# Patient Record
Sex: Female | Born: 1995 | Race: Black or African American | Hispanic: No | Marital: Single | Smoking: Never smoker
Health system: Southern US, Community
[De-identification: ages and names within clinical notes are randomized; demographics above are authoritative.]

## PROBLEM LIST (undated history)

## (undated) HISTORY — PX: VULVA SURGERY: SHX837

---
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
## 2013-03-13 IMAGING — CT CT HEAD W/O CM
1 series · 16 of 30 positions shown, 20 images · non-contrast
Comparison: None.

CLINICAL DATA: Headaches for 1 day.

CT HEAD WITHOUT CONTRAST
TECHNIQUE: Contiguous axial images were obtained from the base of
the skull through the vertex without contrast

[Series 2: head_seq 4.5 h37s st · axial · 0.43mm/px · z∈[-155,-29]mm · 16 of 32 slices shown, 20 images]
[im 2/32  brain]
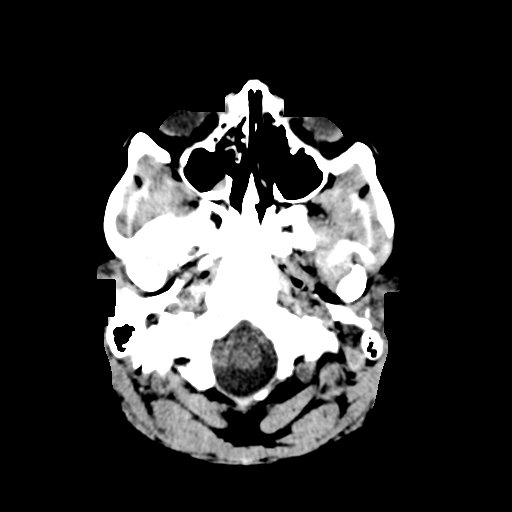
[im 2/32  bone]
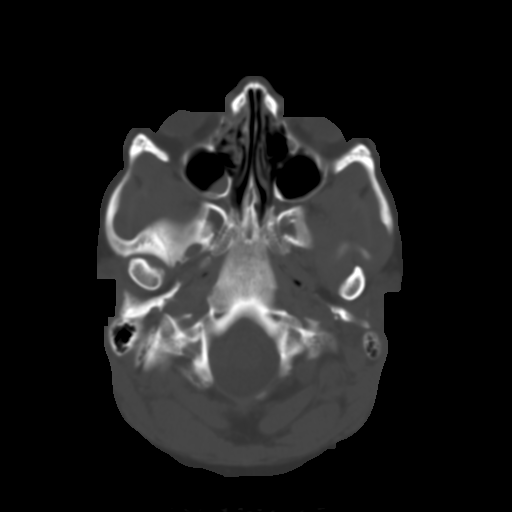
[im 4/32  brain]
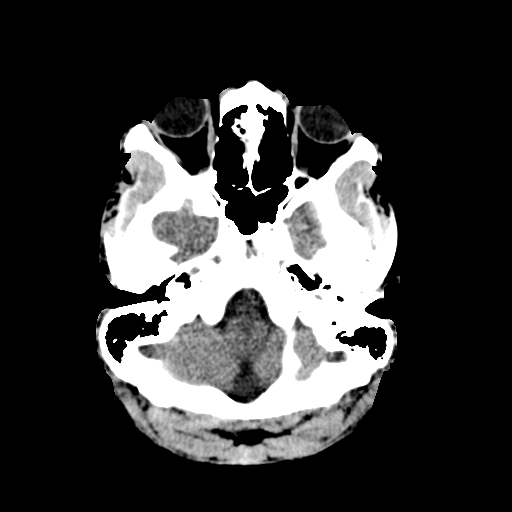
[im 6/32  brain]
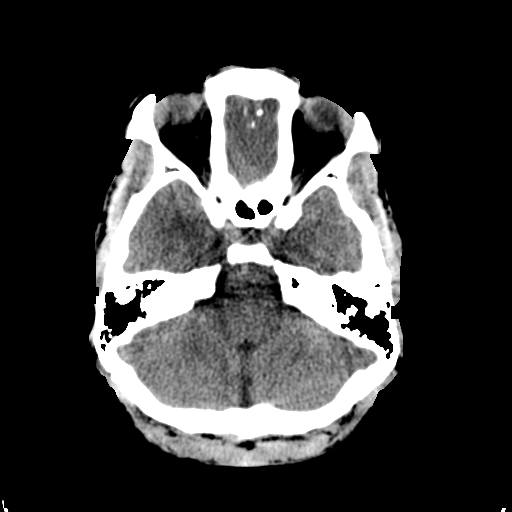
[im 8/32  brain]
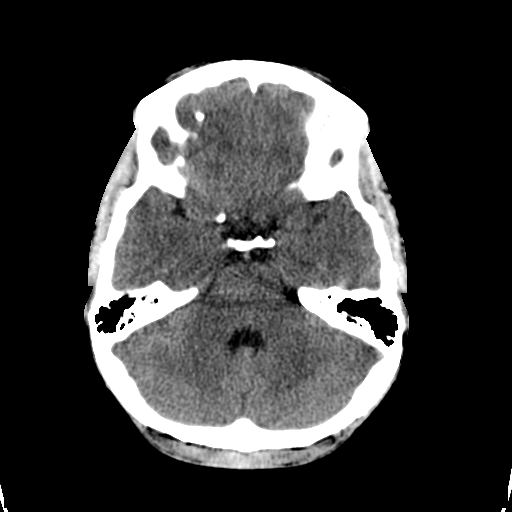
[im 9/32  brain]
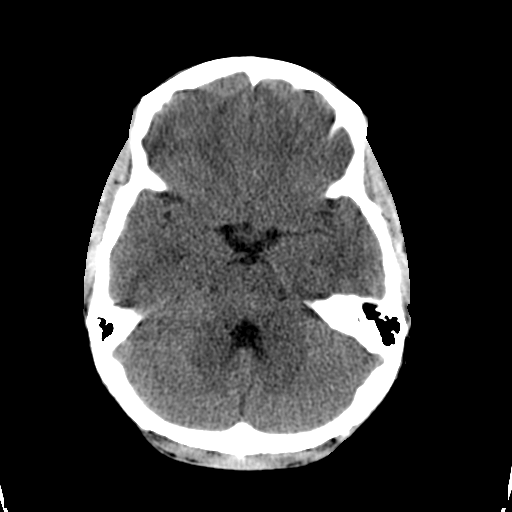
[im 9/32  bone]
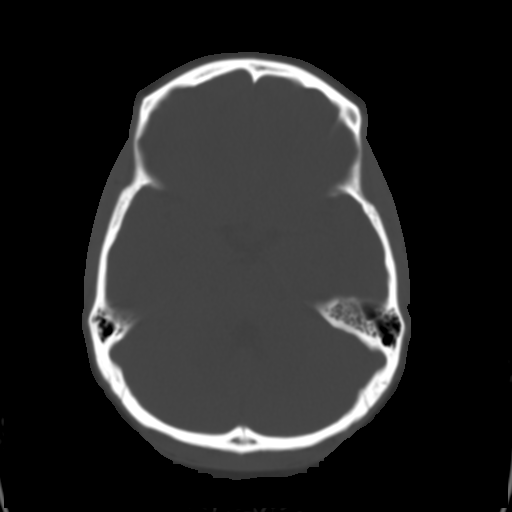
[im 11/32  brain]
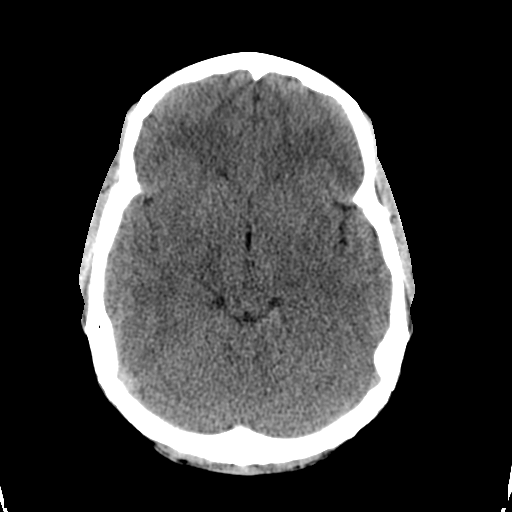
[im 13/32  brain]
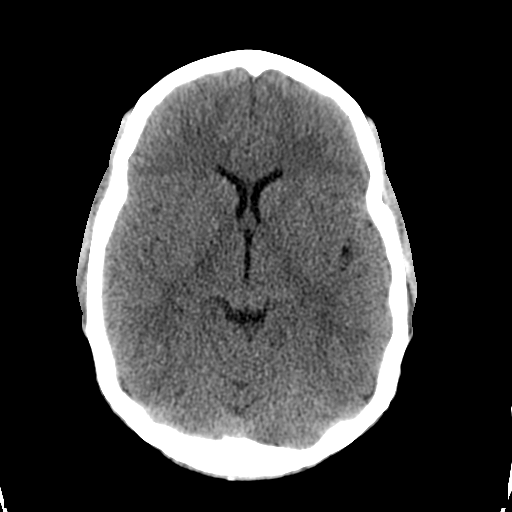
[im 15/32  brain]
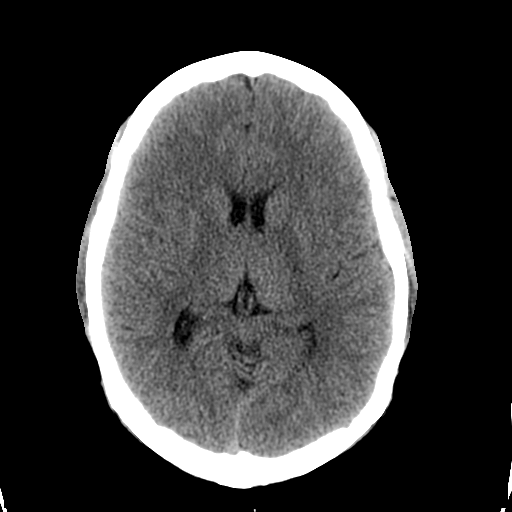
[im 17/32  brain]
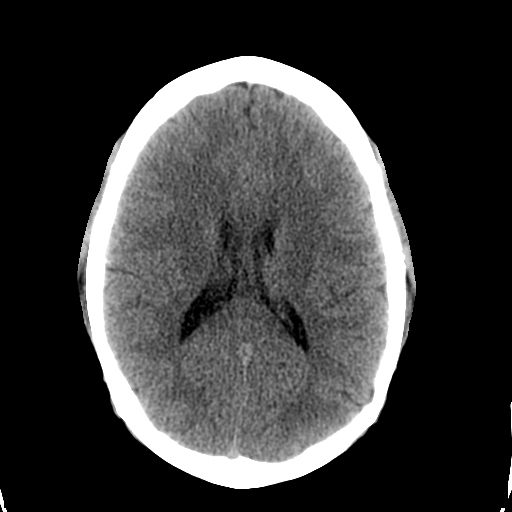
[im 17/32  bone]
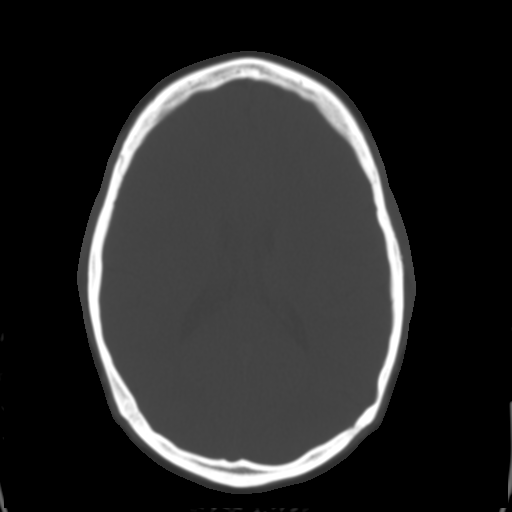
[im 19/32  brain]
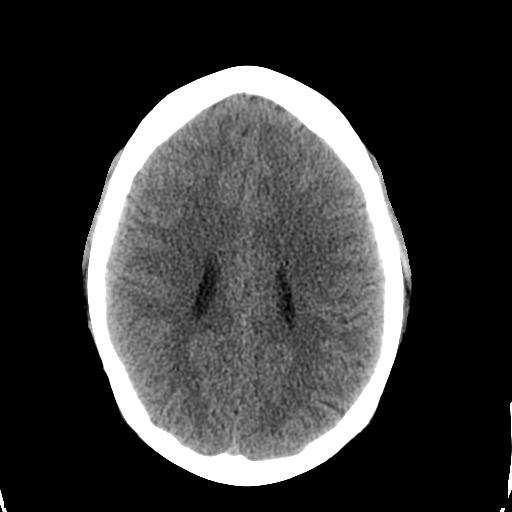
[im 21/32  brain]
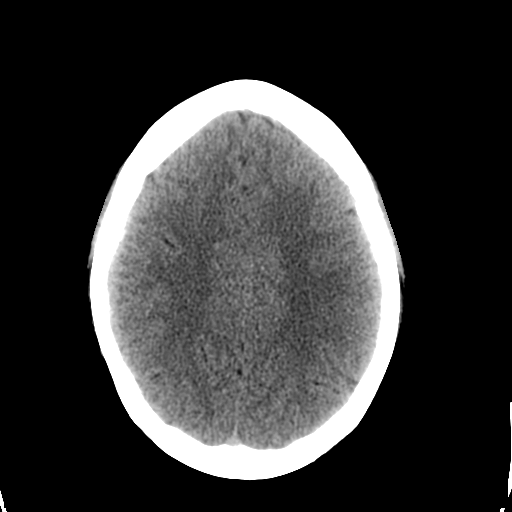
[im 23/32  brain]
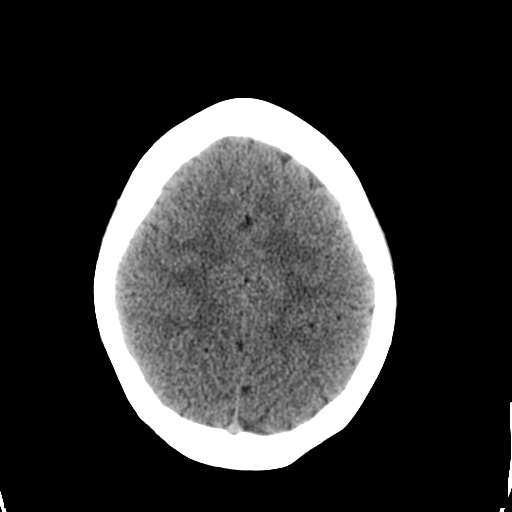
[im 24/32  brain]
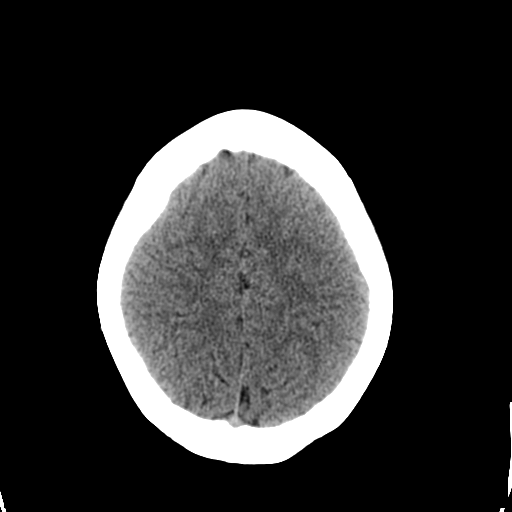
[im 24/32  bone]
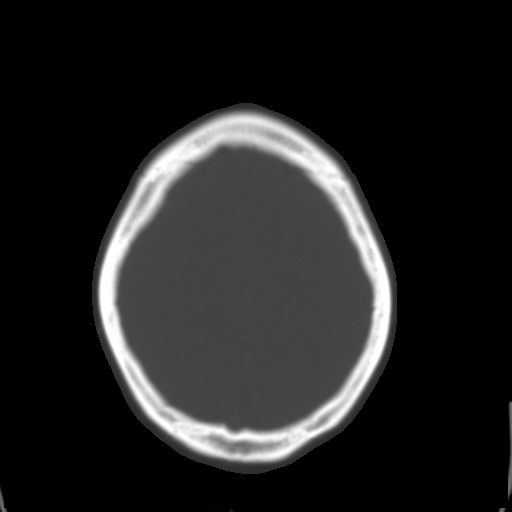
[im 26/32  brain]
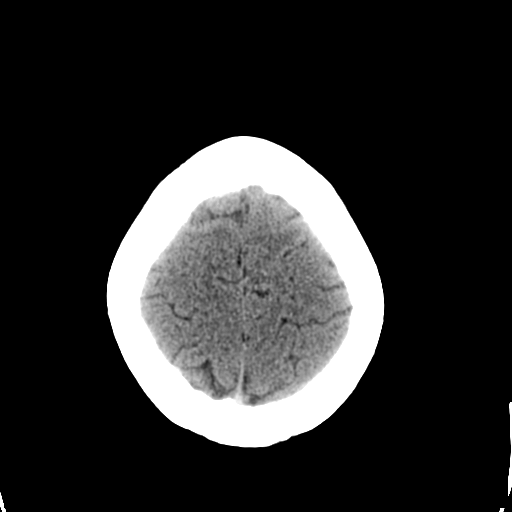
[im 28/32  brain]
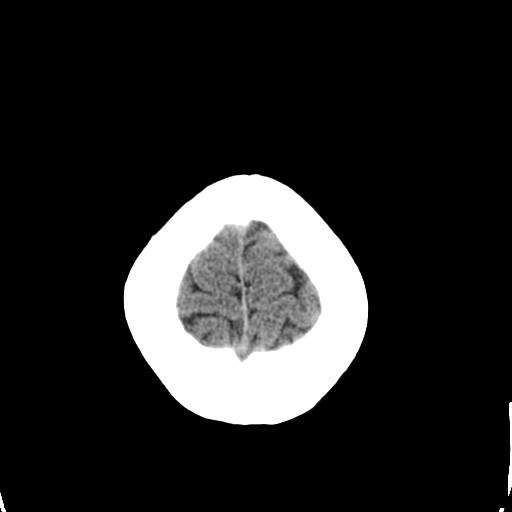
[im 30/32  brain]
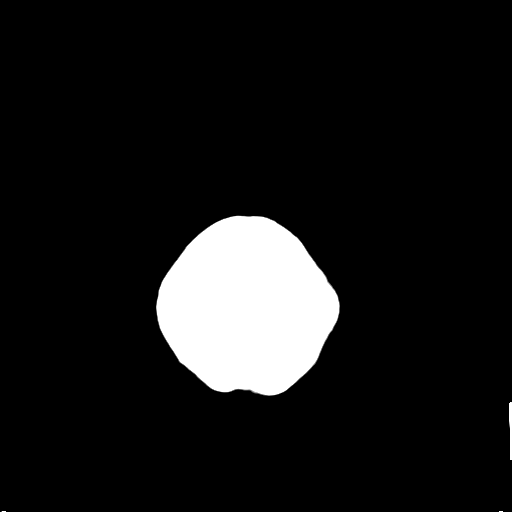

[16 of 30 positions shown; findings below may reference images not displayed]

FINDINGS: The brain has a normal appearance without evidence for
hemorrhage, acute infarction, hydrocephalus, or mass lesion.  There
is no extra axial fluid collection.  The skull is normal.  There is
mild fluid accumulation in the right maxillary sinus, incompletely
evaluated.  Mastoid air cells are clear.
IMPRESSION: Incompletely evaluated right maxillary sinus
fluid.  Normal intracranial compartment.

## 2013-04-25 ENCOUNTER — Ambulatory Visit: Payer: 59 | Admitting: Advanced Practice Midwife

## 2013-10-09 ENCOUNTER — Encounter: Payer: Self-pay | Admitting: Obstetrics & Gynecology

## 2013-10-09 ENCOUNTER — Ambulatory Visit (INDEPENDENT_AMBULATORY_CARE_PROVIDER_SITE_OTHER): Payer: 59 | Admitting: Obstetrics & Gynecology

## 2013-10-09 VITALS — BP 125/74 | HR 76 | Temp 98.7°F | Ht 67.0 in | Wt 231.2 lb

## 2013-10-09 DIAGNOSIS — Z975 Presence of (intrauterine) contraceptive device: Secondary | ICD-10-CM

## 2013-10-09 NOTE — Progress Notes (Signed)
Subjective:     Patient ID: Megan Hickman, female   DOB: 10/17/1995, 18 y.o.   MRN: [id number]  [id number] No LMP recorded. Patient is not currently having periods (Reason: IUD). Concerned about amenorrhea with Mirena since 07/2010. No other problems and wants birth control. Past Medical History  Diagnosis Date  . Asthma    Past Surgical History  Procedure Laterality Date  . Vulva surgery     Allergies  Allergen Reactions  . Other Itching    Animal fur  . Penicillins Other (See Comments)    pt's father has allergy (nausea) but unknown if pt herself has allergy or not  . Shellfish Allergy Swelling   Current Outpatient Prescriptions on File Prior to Visit  Medication Sig Dispense Refill  . albuterol (PROVENTIL HFA;VENTOLIN HFA) 108 (90 BASE) MCG/ACT inhaler Inhale 2 puffs into the lungs every 6 (six) hours as needed for wheezing or shortness of breath. For asthma      . fluconazole (DIFLUCAN) 150 MG tablet Take 150 mg by mouth once.      . hydrOXYzine (ATARAX/VISTARIL) 25 MG tablet Take 1-2 tablets (25-50 mg total) by mouth every 6 (six) hours as needed for itching (may cause drowsiness).  30 tablet  0  . levonorgestrel (MIRENA) 20 MCG/24HR IUD 1 each by Intrauterine route once.      . triamcinolone cream (KENALOG) 0.1 % Apply topically 2 (two) times daily.  45 g  0   Current Facility-Administered Medications on File Prior to Visit  Medication Dose Route Frequency Provider Last Rate Last Dose  . TDaP (BOOSTRIX) injection 0.5 mL  0.5 mL Intramuscular Once Adam PhenixJames G Arnold, MD          Review of Systems  Constitutional: Negative.   Respiratory: Negative.   Genitourinary: Negative for vaginal bleeding, vaginal discharge and menstrual problem.       Objective:   Physical Exam  Constitutional: She is oriented to person, place, and time. She appears well-developed. No distress.  Pulmonary/Chest: Effort normal. No respiratory distress.  Neurological: She is alert and oriented to  person, place, and time.  Skin: Skin is warm and dry.  Psychiatric: She has a normal mood and affect. Her behavior is normal.       Assessment:     Amenorrhea with Mirena      Plan:     Explained reason for no menses and was given reassurance. Remove and possibly replace IUD in 07/2015  Adam PhenixJames G Arnold, MD 10/09/2013

## 2013-10-09 NOTE — Patient Instructions (Signed)
Levonorgestrel intrauterine device (IUD) What is this medicine? LEVONORGESTREL IUD (LEE voe nor jes trel) is a contraceptive (birth control) device. The device is placed inside the uterus by a healthcare professional. It is used to prevent pregnancy and can also be used to treat heavy bleeding that occurs during your period. Depending on the device, it can be used for 3 to 5 years. This medicine may be used for other purposes; ask your health care provider or pharmacist if you have questions. COMMON BRAND NAME(S): Mirena, Skyla What should I tell my health care provider before I take this medicine? They need to know if you have any of these conditions: -abnormal Pap smear -cancer of the breast, uterus, or cervix -diabetes -endometritis -genital or pelvic infection now or in the past -have more than one sexual partner or your partner has more than one partner -heart disease -history of an ectopic or tubal pregnancy -immune system problems -IUD in place -liver disease or tumor -problems with blood clots or take blood-thinners -use intravenous drugs -uterus of unusual shape -vaginal bleeding that has not been explained -an unusual or allergic reaction to levonorgestrel, other hormones, silicone, or polyethylene, medicines, foods, dyes, or preservatives -pregnant or trying to get pregnant -breast-feeding How should I use this medicine? This device is placed inside the uterus by a health care professional. Talk to your pediatrician regarding the use of this medicine in children. Special care may be needed. Overdosage: If you think you have taken too much of this medicine contact a poison control center or emergency room at once. NOTE: This medicine is only for you. Do not share this medicine with others. What if I miss a dose? This does not apply. What may interact with this medicine? Do not take this medicine with any of the following  medications: -amprenavir -bosentan -fosamprenavir This medicine may also interact with the following medications: -aprepitant -barbiturate medicines for inducing sleep or treating seizures -bexarotene -griseofulvin -medicines to treat seizures like carbamazepine, ethotoin, felbamate, oxcarbazepine, phenytoin, topiramate -modafinil -pioglitazone -rifabutin -rifampin -rifapentine -some medicines to treat HIV infection like atazanavir, indinavir, lopinavir, nelfinavir, tipranavir, ritonavir -St. John's wort -warfarin This list may not describe all possible interactions. Give your health care provider a list of all the medicines, herbs, non-prescription drugs, or dietary supplements you use. Also tell them if you smoke, drink alcohol, or use illegal drugs. Some items may interact with your medicine. What should I watch for while using this medicine? Visit your doctor or health care professional for regular check ups. See your doctor if you or your partner has sexual contact with others, becomes HIV positive, or gets a sexual transmitted disease. This product does not protect you against HIV infection (AIDS) or other sexually transmitted diseases. You can check the placement of the IUD yourself by reaching up to the top of your vagina with clean fingers to feel the threads. Do not pull on the threads. It is a good habit to check placement after each menstrual period. Call your doctor right away if you feel more of the IUD than just the threads or if you cannot feel the threads at all. The IUD may come out by itself. You may become pregnant if the device comes out. If you notice that the IUD has come out use a backup birth control method like condoms and call your health care provider. Using tampons will not change the position of the IUD and are okay to use during your period. What side effects may I   notice from receiving this medicine? Side effects that you should report to your doctor or  health care professional as soon as possible: -allergic reactions like skin rash, itching or hives, swelling of the face, lips, or tongue -fever, flu-like symptoms -genital sores -high blood pressure -no menstrual period for 6 weeks during use -pain, swelling, warmth in the leg -pelvic pain or tenderness -severe or sudden headache -signs of pregnancy -stomach cramping -sudden shortness of breath -trouble with balance, talking, or walking -unusual vaginal bleeding, discharge -yellowing of the eyes or skin Side effects that usually do not require medical attention (report to your doctor or health care professional if they continue or are bothersome): -acne -breast pain -change in sex drive or performance -changes in weight -cramping, dizziness, or faintness while the device is being inserted -headache -irregular menstrual bleeding within first 3 to 6 months of use -nausea This list may not describe all possible side effects. Call your doctor for medical advice about side effects. You may report side effects to FDA at 1-800-FDA-1088. Where should I keep my medicine? This does not apply. NOTE: This sheet is a summary. It may not cover all possible information. If you have questions about this medicine, talk to your doctor, pharmacist, or health care provider.  2015, [employer] Standard. (2011-05-05 13:54:04)  

## 2014-02-01 ENCOUNTER — Emergency Department (HOSPITAL_COMMUNITY)
Admission: EM | Admit: 2014-02-01 | Discharge: 2014-02-01 | Disposition: A | Discharge status: Home / Self Care | Payer: 59 | Source: Home / Self Care | Attending: Family Medicine | Admitting: Family Medicine

## 2014-02-01 ENCOUNTER — Encounter (HOSPITAL_COMMUNITY): Payer: Self-pay | Admitting: Emergency Medicine

## 2014-02-01 DIAGNOSIS — M940 Chondrocostal junction syndrome [Tietze]: Secondary | ICD-10-CM

## 2014-02-01 MED ORDER — MELOXICAM 7.5 MG PO TABS: 7.5000 mg | ORAL_TABLET | Freq: Two times a day (BID) | ORAL | Status: DC

## 2014-02-01 NOTE — ED Notes (Signed)
Pt      Reports    Sternal     Pain  X  6  Months   - worse  On  Movement  And   Palpation            Pt  Reports  Pain is  intermiitant     And  Worse  Over  Last  Several  Days

## 2014-02-01 NOTE — ED Provider Notes (Signed)
CSN: [id number]     Arrival date & time 02/01/14  1352 History   First MD Initiated Contact with Patient 02/01/14 1409     Chief Complaint  Patient presents with  . Chest Pain   (Consider location/radiation/quality/duration/timing/severity/associated sxs/prior Treatment) Patient is a 18 y.o. female presenting with chest pain. The history is provided by the patient.  Chest Pain Pain location:  L chest Pain quality: sharp   Pain radiates to:  Does not radiate Pain radiates to the back: no   Pain severity:  Mild Onset quality:  Gradual Duration:  6 months Progression:  Worsening Chronicity:  Chronic Relieved by:  Certain positions Worsened by:  Nothing tried Ineffective treatments:  None tried Associated symptoms: no abdominal pain, no back pain, no cough, no nausea, no palpitations, no shortness of breath and not vomiting     Past Medical History  Diagnosis Date  . Asthma    Past Surgical History  Procedure Laterality Date  . Vulva surgery     Family History  Problem Relation Age of Onset  . Cancer Mother     breast  . Diabetes Father   . Cancer Maternal Aunt     cancer  . Cancer Maternal Grandmother     breast cancer  . Diabetes Paternal Grandmother    History  Substance Use Topics  . Smoking status: Never Smoker   . Smokeless tobacco: Never Used  . Alcohol Use: No   OB History   Grav Para Term Preterm Abortions TAB SAB Ect Mult Living   0              Review of Systems  Constitutional: Negative.   HENT: Negative.   Respiratory: Negative for cough and shortness of breath.   Cardiovascular: Positive for chest pain. Negative for palpitations and leg swelling.  Gastrointestinal: Negative for nausea, vomiting and abdominal pain.  Musculoskeletal: Negative for back pain.    Allergies  Other; Penicillins; and Shellfish allergy  Home Medications   Prior to Admission medications   Medication Sig Start Date End Date Taking? Authorizing Provider  albuterol  (PROVENTIL HFA;VENTOLIN HFA) 108 (90 BASE) MCG/ACT inhaler Inhale 2 puffs into the lungs every 6 (six) hours as needed for wheezing or shortness of breath. For asthma    Historical Provider, MD  fluconazole (DIFLUCAN) 150 MG tablet Take 150 mg by mouth once.    Historical Provider, MD  hydrOXYzine (ATARAX/VISTARIL) 25 MG tablet Take 1-2 tablets (25-50 mg total) by mouth every 6 (six) hours as needed for itching (may cause drowsiness). 11/24/12   Carlisle BeersJohn L Molpus, MD  levonorgestrel (MIRENA) 20 MCG/24HR IUD 1 each by Intrauterine route once.    Historical Provider, MD  meloxicam (MOBIC) 7.5 MG tablet Take 1 tablet (7.5 mg total) by mouth 2 (two) times daily after a meal. 02/01/14   Linna HoffJames D Ethelean Colla, MD  triamcinolone cream (KENALOG) 0.1 % Apply topically 2 (two) times daily. 11/23/12   Ethelda ChickKristi M Smith, MD   BP 150/95  Pulse 102  Temp(Src) 98.3 F (36.8 C) (Oral)  Resp 16  SpO2 100% Physical Exam  Nursing note and vitals reviewed. Constitutional: She is oriented to person, place, and time. She appears well-developed and well-nourished.  HENT:  Right Ear: External ear normal.  Left Ear: External ear normal.  Mouth/Throat: Oropharynx is clear and moist.  Neck: Normal range of motion. Neck supple.  Cardiovascular: Normal rate, regular rhythm, normal heart sounds and intact distal pulses.   Pulmonary/Chest: Effort normal and  breath sounds normal. She exhibits tenderness.  Lymphadenopathy:    She has no cervical adenopathy.  Neurological: She is alert and oriented to person, place, and time.  Skin: Skin is warm and dry.    ED Course  Procedures (including critical care time) Labs Review Labs Reviewed - No data to display  Imaging Review No results found.   MDM   1. Acute costochondritis        Linna HoffJames D Andrian Urbach, MD 02/05/14 2021

## 2014-06-01 IMAGING — US US RENAL
1 series · 14 of 25 positions shown · non-contrast
Comparison: Pelvis radiograph 03/23/2007.

CLINICAL DATA: 15-year-old female left flank pain.
Hematuria.

RENAL/URINARY TRACT ULTRASOUND COMPLETE

[Series 1: us renal · 0.25mm/px · 14 of 32 slices shown]
[im 1/32]
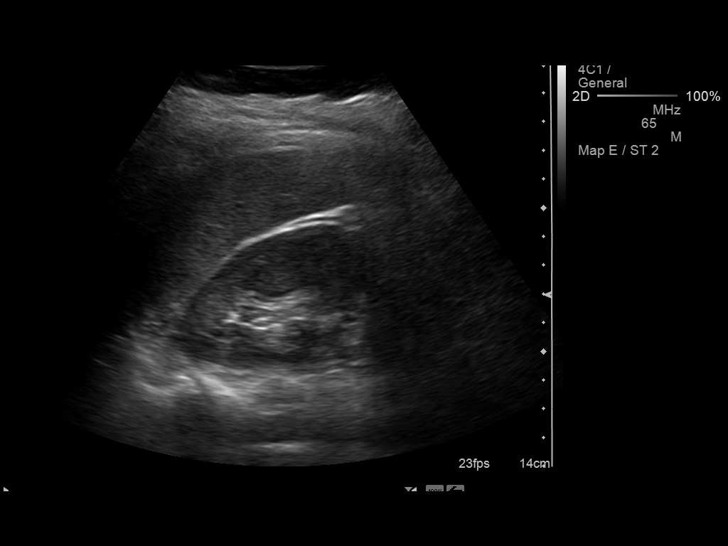
[im 3/32]
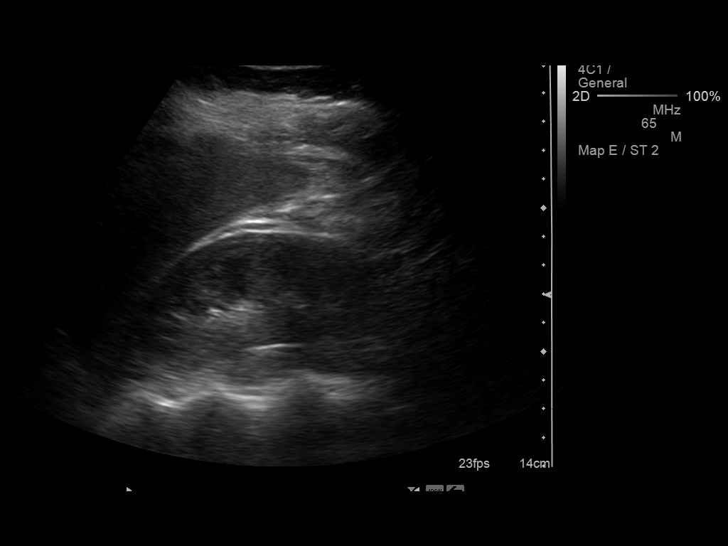
[im 6/32]
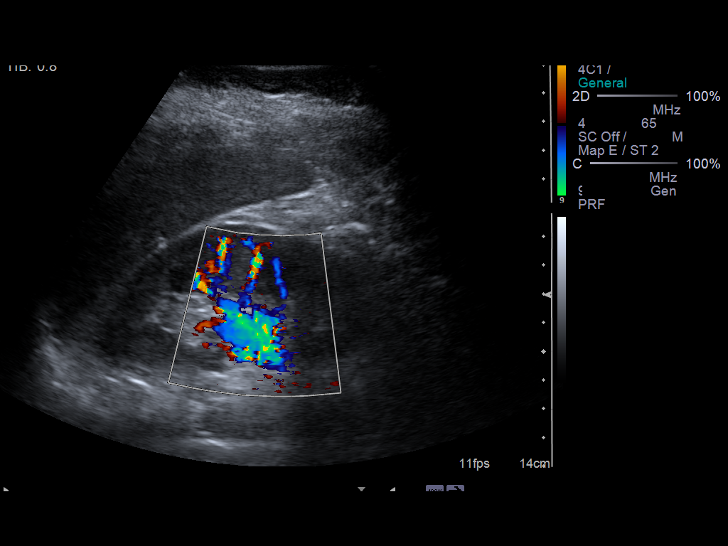
[im 8/32]
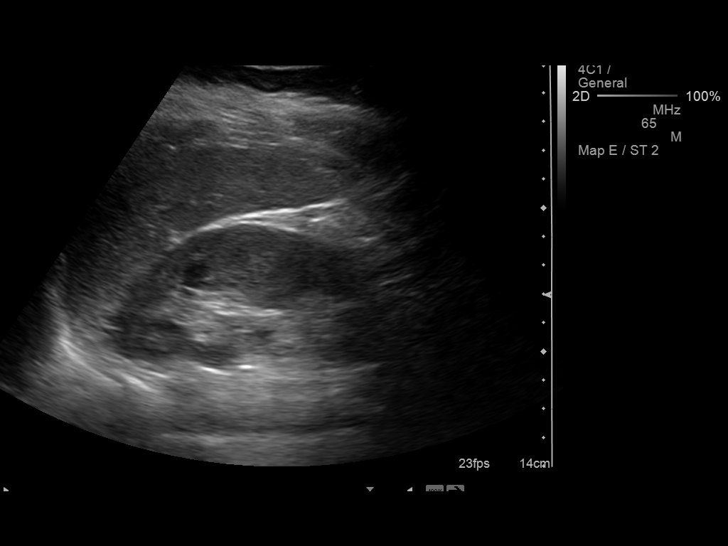
[im 11/32]
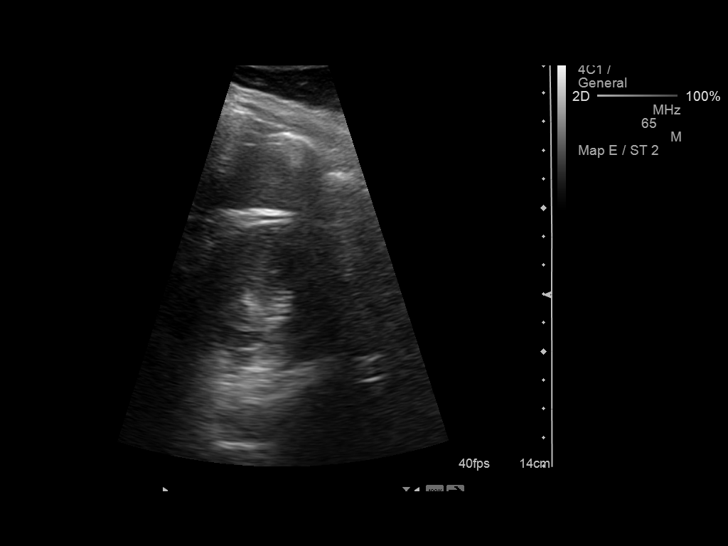
[im 12/32]
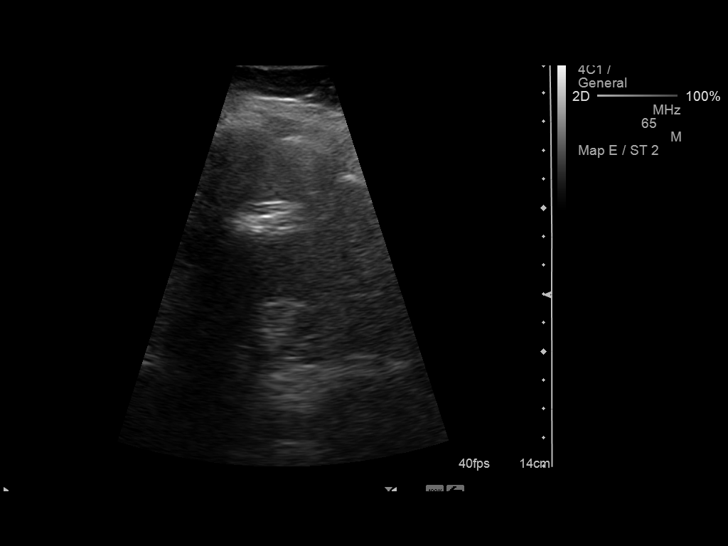
[im 15/32]
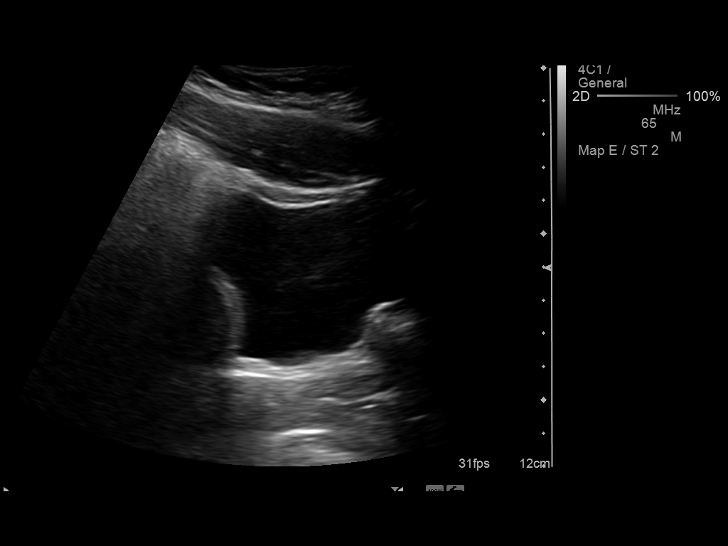
[im 17/32]
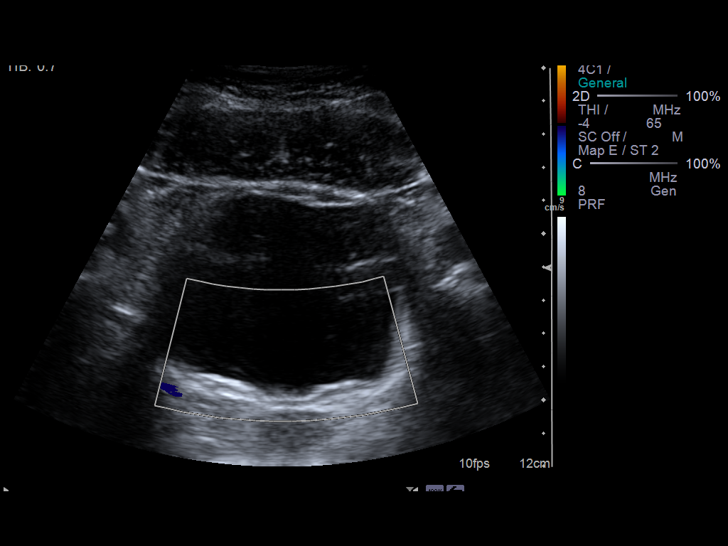
[im 20/32]
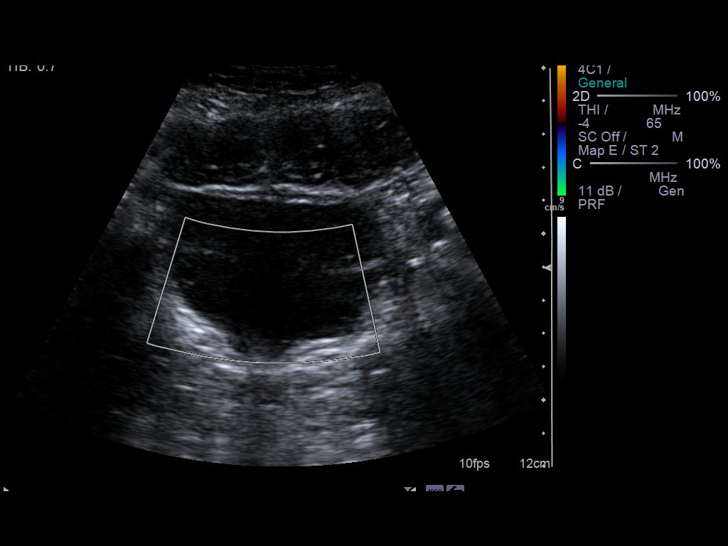
[im 21/32]
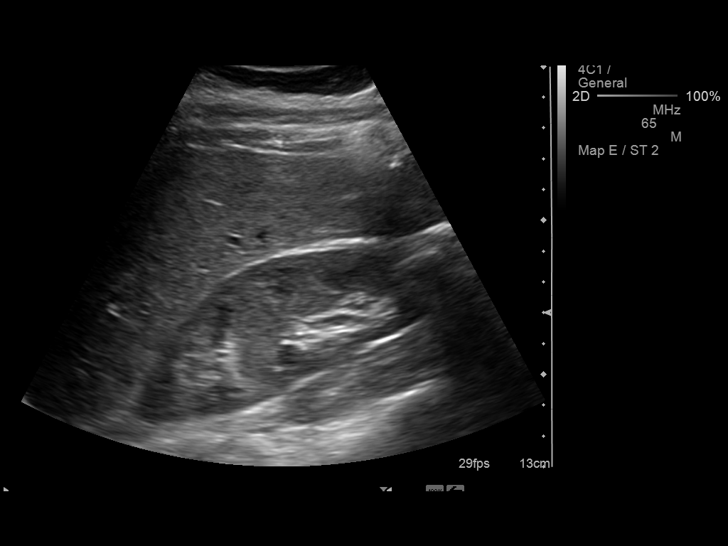
[im 24/32]
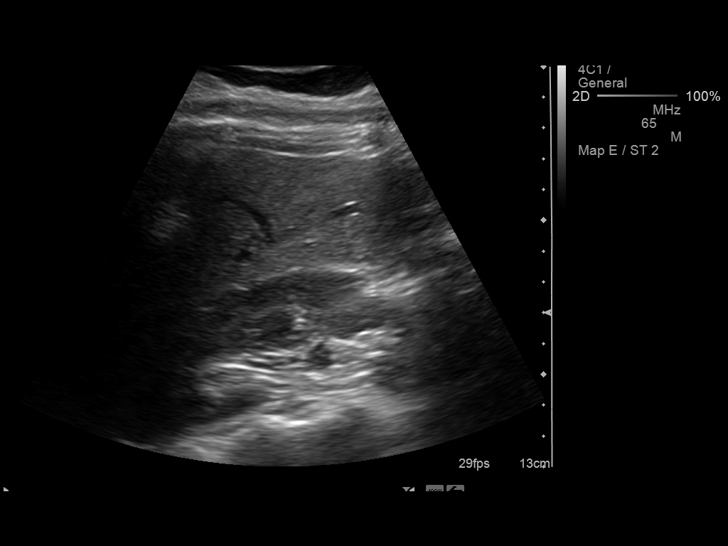
[im 26/32]
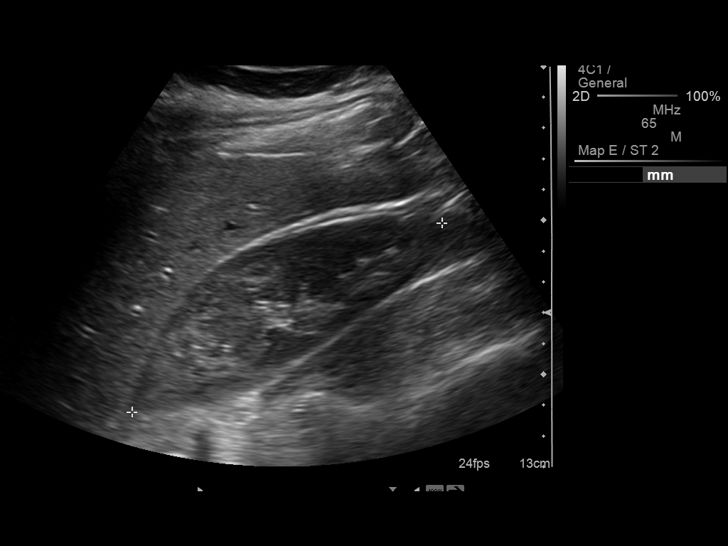
[im 29/32]
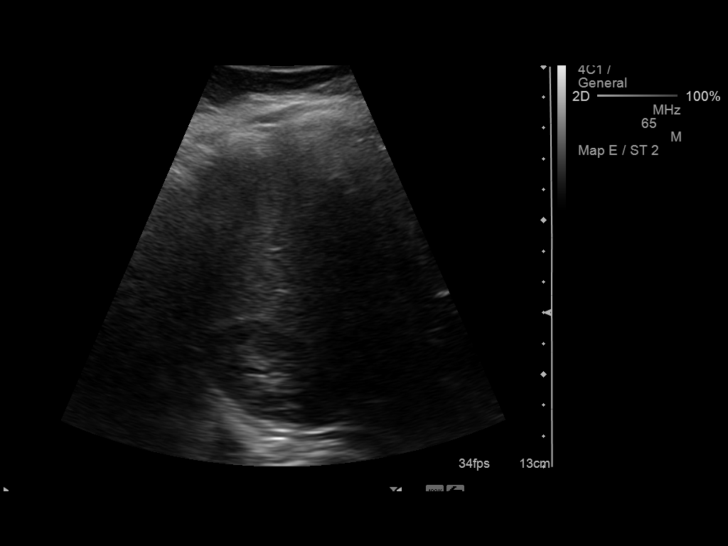
[im 32/32]
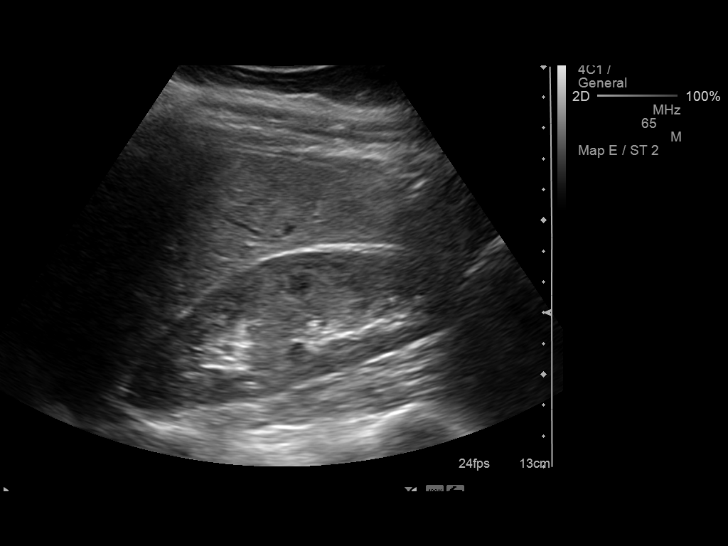

[14 of 25 positions shown; findings below may reference images not displayed]

FINDINGS: Right Kidney:  No hydronephrosis.  Renal length 12.0 cm.  Cortical
echotexture and corticomedullary differentiation within normal
limits.

Left Kidney:  No hydronephrosis.  Suboptimal visualization of the
lower pole due to overlying bowel gas, but no focal left renal
lesion.  Cortical echotexture and corticomedullary differentiation
within normal limits.  Renal length approximately 10 cm.  No focal
left renal lesion. No proximal left hydroureter evident.

Bladder:  Unremarkable.  A right ureteral jet is visible with
Doppler.  The left ureteral jet was not visible.  No distal left
hydroureter is evident.
IMPRESSION: Non visualized left ureteral jet, but otherwise sonographic
appearance of the kidneys and bladder is within normal limits.  No
other secondary findings to suggest obstructive uropathy.

## 2014-11-04 ENCOUNTER — Other Ambulatory Visit: Payer: Self-pay | Admitting: Obstetrics and Gynecology

## 2014-11-05 ENCOUNTER — Encounter: Payer: 59 | Admitting: Certified Nurse Midwife

## 2016-03-23 DIAGNOSIS — H52223 Regular astigmatism, bilateral: Secondary | ICD-10-CM | POA: Diagnosis not present

## 2016-05-13 DIAGNOSIS — F4323 Adjustment disorder with mixed anxiety and depressed mood: Secondary | ICD-10-CM | POA: Diagnosis not present

## 2016-08-04 DIAGNOSIS — J45909 Unspecified asthma, uncomplicated: Secondary | ICD-10-CM | POA: Diagnosis not present

## 2016-08-04 DIAGNOSIS — F4323 Adjustment disorder with mixed anxiety and depressed mood: Secondary | ICD-10-CM | POA: Diagnosis not present

## 2016-08-04 DIAGNOSIS — R51 Headache: Secondary | ICD-10-CM | POA: Diagnosis not present

## 2016-08-05 MED FILL — PROAIR HFA 90 MCG INHALER: 108 (90 BAS | 17 days supply | Qty: 9 | Fill #0

## 2016-08-05 MED FILL — TOPIRAMATE 25 MG TABLET: 25 | 30 days supply | Qty: 30 | Fill #0

## 2016-09-01 MED FILL — PROAIR HFA 90 MCG INHALER: 108 (90 BAS | 17 days supply | Qty: 9 | Fill #1

## 2016-09-16 DIAGNOSIS — R51 Headache: Secondary | ICD-10-CM | POA: Diagnosis not present

## 2016-09-16 DIAGNOSIS — F4323 Adjustment disorder with mixed anxiety and depressed mood: Secondary | ICD-10-CM | POA: Diagnosis not present

## 2016-09-16 DIAGNOSIS — J45909 Unspecified asthma, uncomplicated: Secondary | ICD-10-CM | POA: Diagnosis not present

## 2020-07-23 DIAGNOSIS — F330 Major depressive disorder, recurrent, mild: Secondary | ICD-10-CM | POA: Diagnosis not present

## 2020-08-03 ENCOUNTER — Other Ambulatory Visit: Payer: Self-pay

## 2020-08-03 ENCOUNTER — Encounter (HOSPITAL_COMMUNITY): Payer: Self-pay

## 2020-08-03 ENCOUNTER — Ambulatory Visit (HOSPITAL_COMMUNITY)
Admission: EM | Admit: 2020-08-03 | Discharge: 2020-08-03 | Disposition: A | Discharge status: Home / Self Care | Payer: BC Managed Care – PPO | Attending: Emergency Medicine | Admitting: Emergency Medicine

## 2020-08-03 DIAGNOSIS — M545 Low back pain, unspecified: Secondary | ICD-10-CM | POA: Diagnosis not present

## 2020-08-03 MED ORDER — CYCLOBENZAPRINE HCL 10 MG PO TABS: 10.0000 mg | ORAL_TABLET | Freq: Two times a day (BID) | ORAL | 0 refills | Status: AC | PRN

## 2020-08-03 MED ORDER — NAPROXEN 500 MG PO TABS: 500.0000 mg | ORAL_TABLET | Freq: Two times a day (BID) | ORAL | 0 refills | Status: AC

## 2020-08-03 NOTE — ED Triage Notes (Signed)
Pt c/o back spasms x 3 days. Pt states she felt immobilized and called EMT.

## 2020-08-03 NOTE — Discharge Instructions (Addendum)
Take naproxen 500 mg twice a day for 5 days then as needed  Take flexeril twice a day as needed. Be mindful this can your drowsy   Heating pad 15 minute intervals  Stretching as tolerated

## 2020-08-03 NOTE — ED Provider Notes (Signed)
[facility]    CSN: [id number] Arrival date & time: 08/03/20  1355      History   Chief Complaint No chief complaint on file.   HPI Megan Hickman is a 25 y.o. female.   Patient presents with left sided lower back pain  6/10 starting 2 days ago after picking item off ground. Became stuck and unable to move from position. Required EMT services to get into sitting position. Currently walking with cane for support. Pain radiates into left hip, worsened with twisting turning and bending. Denies numbness, tingling, urinary changes. This has occurred once before. Attempted use of ibuprofen once but EMT told her to discontinue medication. Using heating pad for comfort.   Past Medical History:  Diagnosis Date  . Asthma     Patient Active Problem List   Diagnosis Date Noted  . Elevated blood pressure (not hypertension) 04/23/2012  . IUD (intrauterine device) in place 07/15/2011  . Bacterial vaginosis 07/15/2011    Past Surgical History:  Procedure Laterality Date  . VULVA SURGERY      OB History    Gravida  0   Para      Term      Preterm      AB      Living        SAB      IAB      Ectopic      Multiple      Live Births               Home Medications    Prior to Admission medications   Medication Sig Start Date End Date Taking? Authorizing Provider  cyclobenzaprine (FLEXERIL) 10 MG tablet Take 1 tablet (10 mg total) by mouth 2 (two) times daily as needed for muscle spasms. 08/03/20  Yes Lanis Storlie R, NP  naproxen (NAPROSYN) 500 MG tablet Take 1 tablet (500 mg total) by mouth 2 (two) times daily. 08/03/20  Yes Karmine Kauer R, NP  albuterol (PROVENTIL HFA;VENTOLIN HFA) 108 (90 BASE) MCG/ACT inhaler Inhale 2 puffs into the lungs every 6 (six) hours as needed for wheezing or shortness of breath. For asthma    [provider]  fluconazole (DIFLUCAN) 150 MG tablet Take 150 mg by mouth once.    [provider]  hydrOXYzine  (ATARAX/VISTARIL) 25 MG tablet Take 1-2 tablets (25-50 mg total) by mouth every 6 (six) hours as needed for itching (may cause drowsiness). 11/24/12   Molpus, John, MD  levonorgestrel (MIRENA) 20 MCG/24HR IUD 1 each by Intrauterine route once.    [provider]  triamcinolone cream (KENALOG) 0.1 % Apply topically 2 (two) times daily. 11/23/12   Ethelda Chick, MD    Family History Family History  Problem Relation Age of Onset  . Cancer Mother        breast  . Diabetes Father   . Cancer Maternal Aunt        cancer  . Cancer Maternal Grandmother        breast cancer  . Diabetes Paternal Grandmother     Social History Social History   Tobacco Use  . Smoking status: Never Smoker  . Smokeless tobacco: Never Used  Substance Use Topics  . Alcohol use: No  . Drug use: No     Allergies   Other, Penicillins, and Shellfish allergy   Review of Systems Review of Systems  Constitutional: Negative.   HENT: Negative.   Respiratory: Negative.   Cardiovascular:  Negative.   Musculoskeletal: Positive for back pain. Negative for arthralgias, gait problem, joint swelling, myalgias, neck pain and neck stiffness.  Skin: Negative.   Neurological: Negative.      Physical Exam Triage Vital Signs ED Triage Vitals  Enc Vitals Group     BP 08/03/20 1449 (!) 142/60     Pulse Rate 08/03/20 1449 94     Resp 08/03/20 1449 18     Temp 08/03/20 1449 98.9 F (37.2 C)     Temp Source 08/03/20 1449 Oral     SpO2 08/03/20 1449 100 %     Weight --      Height --      Head Circumference --      Peak Flow --      Pain Score 08/03/20 1448 6     Pain Loc --      Pain Edu? --      Excl. in GC? --    No data found.  Updated Vital Signs BP (!) 142/60 (BP Location: Right Arm)   Pulse 94   Temp 98.9 F (37.2 C) (Oral)   Resp 18   LMP 07/13/2020 (Exact Date)   SpO2 100%   Visual Acuity Right Eye Distance:   Left Eye Distance:   Bilateral Distance:    Right Eye Near:   Left Eye  Near:    Bilateral Near:     Physical Exam Constitutional:      Appearance: Normal appearance. She is normal weight.  HENT:     Head: Normocephalic.  Eyes:     Extraocular Movements: Extraocular movements intact.  Pulmonary:     Effort: Pulmonary effort is normal.  Musculoskeletal:     Cervical back: Normal.     Thoracic back: Normal.     Lumbar back: Tenderness present. No swelling or spasms. Decreased range of motion.       Back:  Skin:    General: Skin is warm and dry.  Neurological:     General: No focal deficit present.     Mental Status: She is alert and oriented to person, place, and time. Mental status is at baseline.  Psychiatric:        Mood and Affect: Mood normal.        Behavior: Behavior normal.        Thought Content: Thought content normal.        Judgment: Judgment normal.      UC Treatments / Results  Labs (all labs ordered are listed, but only abnormal results are displayed) Labs Reviewed - No data to display  EKG   Radiology No results found.  Procedures Procedures (including critical care time)  Medications Ordered in UC Medications - No data to display  Initial Impression / Assessment and Plan / UC Course  I have reviewed the triage vital signs and the nursing notes.  Pertinent labs & imaging results that were available during my care of the patient were reviewed by me and considered in my medical decision making (see chart for details).  Acute left sided low back pain without sciatica  1. Naproxen 500 mg bid  2. Flexeril 10 mg bid 3. Gentle stretching as tolerated 4. Heating pad 15 minute intervals  Final Clinical Impressions(s) / UC Diagnoses   Final diagnoses:  Acute left-sided low back pain without sciatica     Discharge Instructions     Take naproxen 500 mg twice a day for 5 days then as needed  Take flexeril  twice a day as needed. Be mindful this can your drowsy   Heating pad 15 minute intervals  Stretching as  tolerated      ED Prescriptions    Medication Sig Dispense Auth. Provider   naproxen (NAPROSYN) 500 MG tablet Take 1 tablet (500 mg total) by mouth 2 (two) times daily. 30 tablet Miriam Kestler R, NP   cyclobenzaprine (FLEXERIL) 10 MG tablet Take 1 tablet (10 mg total) by mouth 2 (two) times daily as needed for muscle spasms. 20 tablet Valinda Hoar, NP     PDMP not reviewed this encounter.   Valinda Hoar, NP 08/03/20 [phone]

## 2020-08-06 DIAGNOSIS — F330 Major depressive disorder, recurrent, mild: Secondary | ICD-10-CM | POA: Diagnosis not present

## 2020-08-19 DIAGNOSIS — F330 Major depressive disorder, recurrent, mild: Secondary | ICD-10-CM | POA: Diagnosis not present

## 2020-09-02 DIAGNOSIS — F330 Major depressive disorder, recurrent, mild: Secondary | ICD-10-CM | POA: Diagnosis not present

## 2020-09-16 DIAGNOSIS — F330 Major depressive disorder, recurrent, mild: Secondary | ICD-10-CM | POA: Diagnosis not present

## 2020-10-14 DIAGNOSIS — F330 Major depressive disorder, recurrent, mild: Secondary | ICD-10-CM | POA: Diagnosis not present

## 2020-10-15 DIAGNOSIS — R635 Abnormal weight gain: Secondary | ICD-10-CM | POA: Diagnosis not present

## 2020-10-15 DIAGNOSIS — F900 Attention-deficit hyperactivity disorder, predominantly inattentive type: Secondary | ICD-10-CM | POA: Diagnosis not present

## 2020-10-15 DIAGNOSIS — F411 Generalized anxiety disorder: Secondary | ICD-10-CM | POA: Diagnosis not present

## 2020-10-15 DIAGNOSIS — F329 Major depressive disorder, single episode, unspecified: Secondary | ICD-10-CM | POA: Diagnosis not present

## 2020-10-28 DIAGNOSIS — F330 Major depressive disorder, recurrent, mild: Secondary | ICD-10-CM | POA: Diagnosis not present

## 2020-10-30 DIAGNOSIS — F411 Generalized anxiety disorder: Secondary | ICD-10-CM | POA: Diagnosis not present

## 2020-10-30 DIAGNOSIS — R635 Abnormal weight gain: Secondary | ICD-10-CM | POA: Diagnosis not present

## 2020-10-30 DIAGNOSIS — F900 Attention-deficit hyperactivity disorder, predominantly inattentive type: Secondary | ICD-10-CM | POA: Diagnosis not present

## 2020-10-30 DIAGNOSIS — F329 Major depressive disorder, single episode, unspecified: Secondary | ICD-10-CM | POA: Diagnosis not present

## 2020-11-11 DIAGNOSIS — F330 Major depressive disorder, recurrent, mild: Secondary | ICD-10-CM | POA: Diagnosis not present

## 2020-11-12 DIAGNOSIS — F900 Attention-deficit hyperactivity disorder, predominantly inattentive type: Secondary | ICD-10-CM | POA: Diagnosis not present

## 2020-11-12 DIAGNOSIS — F329 Major depressive disorder, single episode, unspecified: Secondary | ICD-10-CM | POA: Diagnosis not present

## 2020-11-12 DIAGNOSIS — F411 Generalized anxiety disorder: Secondary | ICD-10-CM | POA: Diagnosis not present

## 2020-11-12 DIAGNOSIS — E669 Obesity, unspecified: Secondary | ICD-10-CM | POA: Diagnosis not present

## 2020-12-09 DIAGNOSIS — F411 Generalized anxiety disorder: Secondary | ICD-10-CM | POA: Diagnosis not present

## 2020-12-09 DIAGNOSIS — F329 Major depressive disorder, single episode, unspecified: Secondary | ICD-10-CM | POA: Diagnosis not present

## 2020-12-09 DIAGNOSIS — F900 Attention-deficit hyperactivity disorder, predominantly inattentive type: Secondary | ICD-10-CM | POA: Diagnosis not present

## 2020-12-22 DIAGNOSIS — F900 Attention-deficit hyperactivity disorder, predominantly inattentive type: Secondary | ICD-10-CM | POA: Diagnosis not present

## 2020-12-22 DIAGNOSIS — F329 Major depressive disorder, single episode, unspecified: Secondary | ICD-10-CM | POA: Diagnosis not present

## 2020-12-22 DIAGNOSIS — R030 Elevated blood-pressure reading, without diagnosis of hypertension: Secondary | ICD-10-CM | POA: Diagnosis not present

## 2020-12-22 DIAGNOSIS — F411 Generalized anxiety disorder: Secondary | ICD-10-CM | POA: Diagnosis not present

## 2021-06-28 DIAGNOSIS — R609 Edema, unspecified: Secondary | ICD-10-CM | POA: Diagnosis not present

## 2021-06-28 DIAGNOSIS — F329 Major depressive disorder, single episode, unspecified: Secondary | ICD-10-CM | POA: Diagnosis not present

## 2021-06-28 DIAGNOSIS — F900 Attention-deficit hyperactivity disorder, predominantly inattentive type: Secondary | ICD-10-CM | POA: Diagnosis not present

## 2021-06-28 DIAGNOSIS — F411 Generalized anxiety disorder: Secondary | ICD-10-CM | POA: Diagnosis not present

## 2021-08-02 DIAGNOSIS — F411 Generalized anxiety disorder: Secondary | ICD-10-CM | POA: Diagnosis not present

## 2021-08-02 DIAGNOSIS — F900 Attention-deficit hyperactivity disorder, predominantly inattentive type: Secondary | ICD-10-CM | POA: Diagnosis not present

## 2021-08-02 DIAGNOSIS — F329 Major depressive disorder, single episode, unspecified: Secondary | ICD-10-CM | POA: Diagnosis not present

## 2021-10-28 DIAGNOSIS — F900 Attention-deficit hyperactivity disorder, predominantly inattentive type: Secondary | ICD-10-CM | POA: Diagnosis not present

## 2021-10-28 DIAGNOSIS — R030 Elevated blood-pressure reading, without diagnosis of hypertension: Secondary | ICD-10-CM | POA: Diagnosis not present

## 2021-10-28 DIAGNOSIS — F411 Generalized anxiety disorder: Secondary | ICD-10-CM | POA: Diagnosis not present

## 2021-10-28 DIAGNOSIS — F329 Major depressive disorder, single episode, unspecified: Secondary | ICD-10-CM | POA: Diagnosis not present

## 2022-01-06 DIAGNOSIS — F329 Major depressive disorder, single episode, unspecified: Secondary | ICD-10-CM | POA: Diagnosis not present

## 2022-01-06 DIAGNOSIS — F411 Generalized anxiety disorder: Secondary | ICD-10-CM | POA: Diagnosis not present

## 2022-01-06 DIAGNOSIS — F900 Attention-deficit hyperactivity disorder, predominantly inattentive type: Secondary | ICD-10-CM | POA: Diagnosis not present

## 2022-01-06 DIAGNOSIS — E669 Obesity, unspecified: Secondary | ICD-10-CM | POA: Diagnosis not present

## 2022-03-28 DIAGNOSIS — I10 Essential (primary) hypertension: Secondary | ICD-10-CM | POA: Diagnosis not present

## 2022-03-28 DIAGNOSIS — R7309 Other abnormal glucose: Secondary | ICD-10-CM | POA: Diagnosis not present

## 2022-03-31 DIAGNOSIS — I10 Essential (primary) hypertension: Secondary | ICD-10-CM | POA: Diagnosis not present

## 2022-03-31 DIAGNOSIS — R7309 Other abnormal glucose: Secondary | ICD-10-CM | POA: Diagnosis not present

## 2022-04-21 DIAGNOSIS — I10 Essential (primary) hypertension: Secondary | ICD-10-CM | POA: Diagnosis not present

## 2022-04-21 DIAGNOSIS — R7303 Prediabetes: Secondary | ICD-10-CM | POA: Diagnosis not present

## 2022-04-25 DIAGNOSIS — I10 Essential (primary) hypertension: Secondary | ICD-10-CM | POA: Diagnosis not present

## 2022-04-25 DIAGNOSIS — R7303 Prediabetes: Secondary | ICD-10-CM | POA: Diagnosis not present

## 2022-10-28 DIAGNOSIS — Z1331 Encounter for screening for depression: Secondary | ICD-10-CM | POA: Diagnosis not present

## 2022-10-28 DIAGNOSIS — Z8049 Family history of malignant neoplasm of other genital organs: Secondary | ICD-10-CM | POA: Diagnosis not present

## 2022-10-28 DIAGNOSIS — F332 Major depressive disorder, recurrent severe without psychotic features: Secondary | ICD-10-CM | POA: Diagnosis not present

## 2022-10-28 DIAGNOSIS — Z8481 Family history of carrier of genetic disease: Secondary | ICD-10-CM | POA: Diagnosis not present

## 2022-10-28 DIAGNOSIS — R7303 Prediabetes: Secondary | ICD-10-CM | POA: Diagnosis not present

## 2022-10-28 DIAGNOSIS — F411 Generalized anxiety disorder: Secondary | ICD-10-CM | POA: Diagnosis not present

## 2022-10-28 DIAGNOSIS — Z79899 Other long term (current) drug therapy: Secondary | ICD-10-CM | POA: Diagnosis not present

## 2022-11-23 DIAGNOSIS — F332 Major depressive disorder, recurrent severe without psychotic features: Secondary | ICD-10-CM | POA: Diagnosis not present

## 2022-11-23 DIAGNOSIS — I10 Essential (primary) hypertension: Secondary | ICD-10-CM | POA: Diagnosis not present

## 2022-11-23 DIAGNOSIS — Z1331 Encounter for screening for depression: Secondary | ICD-10-CM | POA: Diagnosis not present

## 2022-11-23 DIAGNOSIS — F902 Attention-deficit hyperactivity disorder, combined type: Secondary | ICD-10-CM | POA: Diagnosis not present

## 2023-01-12 DIAGNOSIS — Z8481 Family history of carrier of genetic disease: Secondary | ICD-10-CM | POA: Diagnosis not present

## 2023-01-12 DIAGNOSIS — Z803 Family history of malignant neoplasm of breast: Secondary | ICD-10-CM | POA: Diagnosis not present
